# Patient Record
Sex: Male | Born: 1990 | Race: White | Hispanic: No | Marital: Single | Smoking: Former smoker
Health system: Southern US, Community
[De-identification: ages and names within clinical notes are randomized; demographics above are authoritative.]

## PROBLEM LIST (undated history)

## (undated) DIAGNOSIS — F329 Major depressive disorder, single episode, unspecified: Secondary | ICD-10-CM

## (undated) DIAGNOSIS — F32A Depression, unspecified: Secondary | ICD-10-CM

## (undated) HISTORY — DX: Depression, unspecified: F32.A

## (undated) HISTORY — DX: Major depressive disorder, single episode, unspecified: F32.9

---

## 2011-01-10 ENCOUNTER — Emergency Department (HOSPITAL_COMMUNITY)
Admission: EM | Admit: 2011-01-10 | Discharge: 2011-01-10 | Disposition: A | Discharge status: Home / Self Care | Payer: BC Managed Care – PPO | Attending: Emergency Medicine | Admitting: Emergency Medicine

## 2011-01-10 DIAGNOSIS — F3289 Other specified depressive episodes: Secondary | ICD-10-CM | POA: Insufficient documentation

## 2011-01-10 DIAGNOSIS — R110 Nausea: Secondary | ICD-10-CM | POA: Insufficient documentation

## 2011-01-10 DIAGNOSIS — F329 Major depressive disorder, single episode, unspecified: Secondary | ICD-10-CM | POA: Insufficient documentation

## 2011-01-10 DIAGNOSIS — R42 Dizziness and giddiness: Secondary | ICD-10-CM | POA: Insufficient documentation

## 2011-01-10 DIAGNOSIS — F1910 Other psychoactive substance abuse, uncomplicated: Secondary | ICD-10-CM | POA: Insufficient documentation

## 2013-01-06 ENCOUNTER — Telehealth: Payer: Self-pay | Admitting: Nurse Practitioner

## 2013-01-06 NOTE — Telephone Encounter (Signed)
Spoke with dad and he is aware

## 2013-01-06 NOTE — Telephone Encounter (Signed)
Will have to see dentist- cannot do pain meds without being seen

## 2013-01-06 NOTE — Telephone Encounter (Signed)
Please advise 

## 2013-01-18 ENCOUNTER — Other Ambulatory Visit: Payer: Self-pay | Admitting: Nurse Practitioner

## 2013-04-09 ENCOUNTER — Other Ambulatory Visit: Payer: Self-pay | Admitting: Nurse Practitioner

## 2013-04-10 ENCOUNTER — Other Ambulatory Visit: Payer: Self-pay | Admitting: Nurse Practitioner

## 2013-04-10 MED ORDER — FLUOXETINE HCL 20 MG PO CAPS: 20.0000 mg | ORAL_CAPSULE | Freq: Every day | ORAL | Status: DC

## 2013-04-10 NOTE — Progress Notes (Signed)
Mom notified that rx sent to pharmacy

## 2013-05-11 ENCOUNTER — Telehealth: Payer: Self-pay | Admitting: Nurse Practitioner

## 2013-05-11 MED ORDER — FLUOXETINE HCL 20 MG PO CAPS: 20.0000 mg | ORAL_CAPSULE | Freq: Every day | ORAL | Status: DC

## 2013-05-11 NOTE — Telephone Encounter (Signed)
Refill sent to pharmacy.   

## 2013-05-11 NOTE — Telephone Encounter (Signed)
Mom states that he needs refill on prozac. Will make an appt but she will be out of town before they can come in . Can you refill one time and they will make appt. Please advise

## 2013-05-29 ENCOUNTER — Telehealth: Payer: Self-pay | Admitting: Nurse Practitioner

## 2013-05-29 NOTE — Telephone Encounter (Signed)
Pt very congested with cough appt offered Wanted to try something OTC first Recommended Mucinex

## 2013-05-30 ENCOUNTER — Telehealth: Payer: Self-pay | Admitting: Nurse Practitioner

## 2013-05-30 ENCOUNTER — Ambulatory Visit (INDEPENDENT_AMBULATORY_CARE_PROVIDER_SITE_OTHER): Payer: BC Managed Care – PPO | Admitting: General Practice

## 2013-05-30 ENCOUNTER — Encounter: Payer: Self-pay | Admitting: General Practice

## 2013-05-30 VITALS — BP 135/84 | HR 66 | Temp 98.5°F | Ht 77.0 in | Wt 178.5 lb

## 2013-05-30 DIAGNOSIS — J329 Chronic sinusitis, unspecified: Secondary | ICD-10-CM

## 2013-05-30 MED ORDER — AZITHROMYCIN 250 MG PO TABS: ORAL_TABLET | ORAL | Status: DC

## 2013-05-30 NOTE — Patient Instructions (Signed)

## 2013-05-30 NOTE — Telephone Encounter (Signed)
appt given for today 

## 2013-05-30 NOTE — Progress Notes (Signed)
  Subjective:    Patient ID: Gary Campos, male    DOB: January 21, 1991, 22 y.o.   MRN: [id number]  Sinusitis This is a new problem. The current episode started yesterday. The problem has been gradually worsening since onset. Maximum temperature: fever unmeasured. Associated symptoms include congestion, coughing, sinus pressure, sneezing and a sore throat. Pertinent negatives include no chills, headaches or shortness of breath. Past treatments include acetaminophen. The treatment provided mild relief.      Review of Systems  Constitutional: Negative for chills.  HENT: Positive for congestion, sinus pressure, sneezing and sore throat.   Respiratory: Positive for cough. Negative for chest tightness and shortness of breath.   Cardiovascular: Negative for chest pain and palpitations.  Neurological: Negative for dizziness, weakness and headaches.       Objective:   Physical Exam  Constitutional: He is oriented to person, place, and time. He appears well-developed and well-nourished.  HENT:  Head: Normocephalic and atraumatic.  Right Ear: External ear normal.  Left Ear: External ear normal.  Nose: Right sinus exhibits maxillary sinus tenderness and frontal sinus tenderness. Left sinus exhibits maxillary sinus tenderness and frontal sinus tenderness.  Mouth/Throat: Posterior oropharyngeal erythema present.  Cardiovascular: Normal rate, regular rhythm and normal heart sounds.   Pulmonary/Chest: Effort normal and breath sounds normal. No respiratory distress. He exhibits no tenderness.  Neurological: He is alert and oriented to person, place, and time.  Skin: Skin is warm and dry.  Psychiatric: He has a normal mood and affect.          Assessment & Plan:  1. Sinusitis - azithromycin (ZITHROMAX) 250 MG tablet; Take as directed  Dispense: 6 tablet; Refill: 0 -increase fluids -RTO if symptoms worsen or unresolved -Patient verbalized understanding -Coralie Keens, FNP-C

## 2013-06-27 ENCOUNTER — Encounter: Payer: Self-pay | Admitting: Nurse Practitioner

## 2013-06-27 ENCOUNTER — Ambulatory Visit (INDEPENDENT_AMBULATORY_CARE_PROVIDER_SITE_OTHER): Payer: BC Managed Care – PPO | Admitting: Nurse Practitioner

## 2013-06-27 VITALS — BP 119/72 | HR 56 | Temp 97.6°F | Ht 77.0 in | Wt 185.0 lb

## 2013-06-27 DIAGNOSIS — F32A Depression, unspecified: Secondary | ICD-10-CM | POA: Insufficient documentation

## 2013-06-27 DIAGNOSIS — F329 Major depressive disorder, single episode, unspecified: Secondary | ICD-10-CM

## 2013-06-27 DIAGNOSIS — Z23 Encounter for immunization: Secondary | ICD-10-CM

## 2013-06-27 MED ORDER — FLUOXETINE HCL 10 MG PO CAPS: 10.0000 mg | ORAL_CAPSULE | Freq: Every day | ORAL | Status: DC

## 2013-06-27 NOTE — Patient Instructions (Signed)
Stress Management Stress is a state of physical or mental tension that often results from changes in your life or normal routine. Some common causes of stress are:  Death of a loved one.  Injuries or severe illnesses.  Getting fired or changing jobs.  Moving into a new home. Other causes may be:  Sexual problems.  Business or financial losses.  Taking on a large debt.  Regular conflict with someone at home or at work.  Constant tiredness from lack of sleep. It is not just bad things that are stressful. It may be stressful to:  Win the lottery.  Get married.  Buy a new car. The amount of stress that can be easily tolerated varies from person to person. Changes generally cause stress, regardless of the types of change. Too much stress can affect your health. It may lead to physical or emotional problems. Too little stress (boredom) may also become stressful. SUGGESTIONS TO REDUCE STRESS:  Talk things over with your family and friends. It often is helpful to share your concerns and worries. If you feel your problem is serious, you may want to get help from a professional counselor.  Consider your problems one at a time instead of lumping them all together. Trying to take care of everything at once may seem impossible. List all the things you need to do and then start with the most important one. Set a goal to accomplish 2 or 3 things each day. If you expect to do too many in a single day you will naturally fail, causing you to feel even more stressed.  Do not use alcohol or drugs to relieve stress. Although you may feel better for a short time, they do not remove the problems that caused the stress. They can also be habit forming.  Exercise regularly - at least 3 times per week. Physical exercise can help to relieve that "uptight" feeling and will relax you.  The shortest distance between despair and hope is often a good night's sleep.  Go to bed and get up on time allowing  yourself time for appointments without being rushed.  Take a short "time-out" period from any stressful situation that occurs during the day. Close your eyes and take some deep breaths. Starting with the muscles in your face, tense them, hold it for a few seconds, then relax. Repeat this with the muscles in your neck, shoulders, hand, stomach, back and legs.  Take good care of yourself. Eat a balanced diet and get plenty of rest.  Schedule time for having fun. Take a break from your daily routine to relax. HOME CARE INSTRUCTIONS   Call if you feel overwhelmed by your problems and feel you can no longer manage them on your own.  Return immediately if you feel like hurting yourself or someone else. Document Released: 01/06/2001 Document Revised: 10/05/2011 Document Reviewed: 03/07/2013 [employer] Patient Information 2014 [employer].  

## 2013-06-27 NOTE — Progress Notes (Signed)
   Subjective:    Patient ID: Gary Campos, male    DOB: March 05, 1991, 22 y.o.   MRN: [id number]  HPI  patient in today to discuss depression- he is currently on prozac 20- patient says that he is doing well and doesn't need it anymore. Wants to wean hisself off of meds.    Review of Systems  All other systems reviewed and are negative.       Objective:   Physical Exam  Constitutional: He is oriented to person, place, and time. He appears well-developed and well-nourished.  Cardiovascular: Normal rate, regular rhythm and normal heart sounds.   Pulmonary/Chest: Effort normal and breath sounds normal.  Neurological: He is alert and oriented to person, place, and time.  Psychiatric: He has a normal mood and affect. His behavior is normal. Judgment and thought content normal.    BP 119/72  Pulse 56  Temp(Src) 97.6 F (36.4 C) (Oral)  Ht 6\' 5"  (1.956 m)  Wt 185 lb (83.915 kg)  BMI 21.93 kg/m2       Assessment & Plan:   1. Depression    Meds ordered this encounter  Medications  . FLUoxetine (PROZAC) 10 MG capsule    Sig: Take 1 capsule (10 mg total) by mouth daily.    Dispense:  30 capsule    Refill:  3    Order Specific Question:  Supervising Provider    Answer:  Deborra Medina   Decrease from 20mg  of prozac to 10mg  daily- patient instructed how to wean off if wants to stop Stress management Follow up prn  Mary-Margaret Daphine Deutscher, FNP

## 2013-08-28 ENCOUNTER — Telehealth: Payer: Self-pay | Admitting: Nurse Practitioner

## 2013-08-28 NOTE — Telephone Encounter (Signed)
Patient is using an OTC cream. Appt scheduled for tomorrow.

## 2013-08-29 ENCOUNTER — Ambulatory Visit (INDEPENDENT_AMBULATORY_CARE_PROVIDER_SITE_OTHER): Payer: BC Managed Care – PPO | Admitting: Family Medicine

## 2013-08-29 ENCOUNTER — Encounter: Payer: Self-pay | Admitting: Family Medicine

## 2013-08-29 VITALS — BP 137/93 | HR 101 | Temp 97.3°F | Ht 77.0 in | Wt 188.0 lb

## 2013-08-29 DIAGNOSIS — L255 Unspecified contact dermatitis due to plants, except food: Secondary | ICD-10-CM

## 2013-08-29 DIAGNOSIS — L237 Allergic contact dermatitis due to plants, except food: Secondary | ICD-10-CM

## 2013-08-29 MED ORDER — METHYLPREDNISOLONE ACETATE 80 MG/ML IJ SUSP: 80.0000 mg | Freq: Once | INTRAMUSCULAR | Status: DC

## 2013-08-29 MED ORDER — HYDROXYZINE HCL 25 MG PO TABS: 25.0000 mg | ORAL_TABLET | Freq: Three times a day (TID) | ORAL | Status: DC | PRN

## 2013-08-29 MED ORDER — METHYLPREDNISOLONE (PAK) 4 MG PO TABS: ORAL_TABLET | ORAL | Status: DC

## 2013-08-29 NOTE — Patient Instructions (Signed)
Poison Ivy Poison ivy is a inflammation of the skin (contact dermatitis) caused by touching the allergens on the leaves of the ivy plant following previous exposure to the plant. The rash usually appears 48 hours after exposure. The rash is usually bumps (papules) or blisters (vesicles) in a linear pattern. Depending on your own sensitivity, the rash may simply cause redness and itching, or it may also progress to blisters which may break open. These must be well cared for to prevent secondary bacterial (germ) infection, followed by scarring. Keep any open areas dry, clean, dressed, and covered with an antibacterial ointment if needed. The eyes may also get puffy. The puffiness is worst in the morning and gets better as the day progresses. This dermatitis usually heals without scarring, within 2 to 3 weeks without treatment. HOME CARE INSTRUCTIONS  Thoroughly wash with soap and water as soon as you have been exposed to poison ivy. You have about one half hour to remove the plant resin before it will cause the rash. This washing will destroy the oil or antigen on the skin that is causing, or will cause, the rash. Be sure to wash under your fingernails as any plant resin there will continue to spread the rash. Do not rub skin vigorously when washing affected area. Poison ivy cannot spread if no oil from the plant remains on your body. A rash that has progressed to weeping sores will not spread the rash unless you have not washed thoroughly. It is also important to wash any clothes you have been wearing as these may carry active allergens. The rash will return if you wear the unwashed clothing, even several days later. Avoidance of the plant in the future is the best measure. Poison ivy plant can be recognized by the number of leaves. Generally, poison ivy has three leaves with flowering branches on a single stem. Diphenhydramine may be purchased over the counter and used as needed for itching. Do not drive with  this medication if it makes you drowsy.Ask your caregiver about medication for children. SEEK MEDICAL CARE IF:  Open sores develop.  Redness spreads beyond area of rash.  You notice purulent (pus-like) discharge.  You have increased pain.  Other signs of infection develop (such as fever). Document Released: 07/10/2000 Document Revised: 10/05/2011 Document Reviewed: 05/29/2009 Exit[employer] Patient Information 2014 [employer].  

## 2013-08-29 NOTE — Progress Notes (Signed)
   Subjective:    Patient ID: Gary Campos, male    DOB: [birth year], 23 y.o.   MRN: [id number]  HPI This 23 y.o. male presents for evaluation of dermatitis from poison ivy exposure.   Review of Systems No chest pain, SOB, HA, dizziness, vision change, N/V, diarrhea, constipation, dysuria, urinary urgency or frequency, myalgias, arthralgias or rash.     Objective:   Physical Exam Vital signs noted  Well developed well nourished male.  HEENT - Head atraumatic Normocephalic                Eyes - PERRLA, Conjuctiva - clear Sclera- Clear EOMI                Ears - EAC's Wnl TM's Wnl Gross Hearing WNL Respiratory - Lungs CTA bilateral Cardiac - RRR S1 and S2 without murmur Skin - erythematous raised rash on right abdomen, bilateral arms and legs       Assessment & Plan:   Poison ivy dermatitis - Plan: methylPREDNIsolone (MEDROL DOSPACK) 4 MG tablet, hydrOXYzine (ATARAX/VISTARIL) 25 MG tablet, methylPREDNISolone acetate (DEPO-MEDROL) injection 80 mg  Aveeno baths prn and follow up if not better.  Lysbeth Penner FNP

## 2013-10-10 ENCOUNTER — Other Ambulatory Visit: Payer: Self-pay

## 2013-10-10 MED ORDER — FLUOXETINE HCL 10 MG PO CAPS: 10.0000 mg | ORAL_CAPSULE | Freq: Every day | ORAL | Status: DC

## 2013-12-13 ENCOUNTER — Ambulatory Visit (INDEPENDENT_AMBULATORY_CARE_PROVIDER_SITE_OTHER): Payer: BC Managed Care – PPO | Admitting: Physician Assistant

## 2013-12-13 ENCOUNTER — Ambulatory Visit: Payer: BC Managed Care – PPO | Admitting: Family Medicine

## 2013-12-13 ENCOUNTER — Encounter: Payer: Self-pay | Admitting: Physician Assistant

## 2013-12-13 VITALS — BP 151/88 | HR 64 | Temp 98.1°F | Ht 77.0 in | Wt 198.0 lb

## 2013-12-13 DIAGNOSIS — R0789 Other chest pain: Secondary | ICD-10-CM

## 2013-12-13 DIAGNOSIS — R071 Chest pain on breathing: Secondary | ICD-10-CM

## 2013-12-13 MED ORDER — MELOXICAM 15 MG PO TABS: 15.0000 mg | ORAL_TABLET | Freq: Every day | ORAL | Status: DC

## 2013-12-13 NOTE — Patient Instructions (Signed)

## 2013-12-13 NOTE — Progress Notes (Signed)
Subjective:     Patient ID: Gary Campos, male   DOB: Dec 13, 1990, 23 y.o.   MRN: [id number]  HPI Pt with R ant chest wall pain since this am States he was stretching and felt a pop He has a hx of same but not the pain  Review of Systems + pain with cough/deep breath No SOB No prod cough Denies any recent URI sx No OTC meds for sx    Objective:   Physical Exam NAD Heart-RRR w/o M Lungs- CTA bilat + TTP along the R side of sternum only     Assessment:     Chest wall pain    Plan:     Heat/Ice Mobic rx 15mg  qd #10 Work note  F/U prn

## 2013-12-24 ENCOUNTER — Other Ambulatory Visit: Payer: Self-pay | Admitting: Family Medicine

## 2014-04-14 ENCOUNTER — Other Ambulatory Visit: Payer: Self-pay | Admitting: Physician Assistant

## 2014-04-16 NOTE — Telephone Encounter (Signed)
no more refills without being seen  

## 2014-04-16 NOTE — Telephone Encounter (Signed)
Last ov 5/15 for chest pain. Last ov for depression for 12/14. ntbs.

## 2014-05-12 ENCOUNTER — Telehealth: Payer: Self-pay | Admitting: Nurse Practitioner

## 2014-05-12 ENCOUNTER — Ambulatory Visit (INDEPENDENT_AMBULATORY_CARE_PROVIDER_SITE_OTHER): Payer: BC Managed Care – PPO

## 2014-05-12 DIAGNOSIS — Z23 Encounter for immunization: Secondary | ICD-10-CM

## 2014-05-14 NOTE — Telephone Encounter (Signed)
appt made

## 2014-05-18 ENCOUNTER — Ambulatory Visit (INDEPENDENT_AMBULATORY_CARE_PROVIDER_SITE_OTHER): Payer: BC Managed Care – PPO | Admitting: Nurse Practitioner

## 2014-05-18 ENCOUNTER — Encounter: Payer: Self-pay | Admitting: Nurse Practitioner

## 2014-05-18 VITALS — BP 158/94 | HR 62 | Temp 98.6°F | Ht 77.0 in | Wt 200.4 lb

## 2014-05-18 DIAGNOSIS — F329 Major depressive disorder, single episode, unspecified: Secondary | ICD-10-CM

## 2014-05-18 DIAGNOSIS — F32A Depression, unspecified: Secondary | ICD-10-CM

## 2014-05-18 MED ORDER — FLUOXETINE HCL 10 MG PO CAPS: ORAL_CAPSULE | ORAL | Status: DC

## 2014-05-18 NOTE — Progress Notes (Signed)
   Subjective:    Patient ID: Gary Campos, male    DOB: 03-13-1991, 23 y.o.   MRN: [id number]  HPI Patient in today for follow up of depression and anxiety- He is currently on prozac- works well and keeps him calm- No c/o side effects.    Review of Systems  Constitutional: Negative.   HENT: Negative.   Respiratory: Negative.   Cardiovascular: Negative.   Genitourinary: Negative.   Neurological: Negative.   Psychiatric/Behavioral: Negative.   All other systems reviewed and are negative.      Objective:   Physical Exam  Constitutional: He is oriented to person, place, and time. He appears well-developed and well-nourished.  Cardiovascular: Normal rate, regular rhythm and normal heart sounds.   Pulmonary/Chest: Effort normal and breath sounds normal.  Neurological: He is alert and oriented to person, place, and time.  Skin: Skin is warm and dry.  Psychiatric: He has a normal mood and affect. His behavior is normal. Judgment and thought content normal.   BP 158/94  Pulse 62  Temp(Src) 98.6 F (37 C) (Oral)  Ht 6\' 5"  (1.956 m)  Wt 200 lb 6.4 oz (90.901 kg)  BMI 23.76 kg/m2        Assessment & Plan:  1. Depression Stress management - FLUoxetine (PROZAC) 10 MG capsule; TAKE 1 CAPSULE (10 MG TOTAL) BY MOUTH DAILY.  Dispense: 30 capsule; Refill: [place], FNP

## 2014-05-18 NOTE — Patient Instructions (Signed)
Stress and Stress Management Stress is a normal reaction to life events. It is what you feel when life demands more than you are used to or more than you can handle. Some stress can be useful. For example, the stress reaction can help you catch the last bus of the day, study for a test, or meet a deadline at work. But stress that occurs too often or for too long can cause problems. It can affect your emotional health and interfere with relationships and normal daily activities. Too much stress can weaken your immune system and increase your risk for physical illness. If you already have a medical problem, stress can make it worse. CAUSES  All sorts of life events may cause stress. An event that causes stress for one person may not be stressful for another person. Major life events commonly cause stress. These may be positive or negative. Examples include losing your job, moving into a new home, getting married, having a baby, or losing a loved one. Less obvious life events may also cause stress, especially if they occur day after day or in combination. Examples include working long hours, driving in traffic, caring for children, being in debt, or being in a difficult relationship. SIGNS AND SYMPTOMS Stress may cause emotional symptoms including, the following:  Anxiety. This is feeling worried, afraid, on edge, overwhelmed, or out of control.  Anger. This is feeling irritated or impatient.  Depression. This is feeling sad, down, helpless, or guilty.  Difficulty focusing, remembering, or making decisions. Stress may cause physical symptoms, including the following:   Aches and pains. These may affect your head, neck, back, stomach, or other areas of your body.  Tight muscles or clenched jaw.  Low energy or trouble sleeping. Stress may cause unhealthy behaviors, including the following:   Eating to feel better (overeating) or skipping meals.  Sleeping too little, too much, or both.  Working  too much or putting off tasks (procrastination).  Smoking, drinking alcohol, or using drugs to feel better. DIAGNOSIS  Stress is diagnosed through an assessment by your health care provider. Your health care provider will ask questions about your symptoms and any stressful life events.Your health care provider will also ask about your medical history and may order blood tests or other tests. Certain medical conditions and medicine can cause physical symptoms similar to stress. Mental illness can cause emotional symptoms and unhealthy behaviors similar to stress. Your health care provider may refer you to a mental health professional for further evaluation.  TREATMENT  Stress management is the recommended treatment for stress.The goals of stress management are reducing stressful life events and coping with stress in healthy ways.  Techniques for reducing stressful life events include the following:  Stress identification. Self-monitor for stress and identify what causes stress for you. These skills may help you to avoid some stressful events.  Time management. Set your priorities, keep a calendar of events, and learn to say "no." These tools can help you avoid making too many commitments. Techniques for coping with stress include the following:  Rethinking the problem. Try to think realistically about stressful events rather than ignoring them or overreacting. Try to find the positives in a stressful situation rather than focusing on the negatives.  Exercise. Physical exercise can release both physical and emotional tension. The key is to find a form of exercise you enjoy and do it regularly.  Relaxation techniques. These relax the body and mind. Examples include yoga, meditation, tai chi, biofeedback, deep  breathing, progressive muscle relaxation, listening to music, being out in nature, journaling, and other hobbies. Again, the key is to find one or more that you enjoy and can do  regularly.  Healthy lifestyle. Eat a balanced diet, get plenty of sleep, and do not smoke. Avoid using alcohol or drugs to relax.  Strong support network. Spend time with family, friends, or other people you enjoy being around.Express your feelings and talk things over with someone you trust. Counseling or talktherapy with a mental health professional may be helpful if you are having difficulty managing stress on your own. Medicine is typically not recommended for the treatment of stress.Talk to your health care provider if you think you need medicine for symptoms of stress. HOME CARE INSTRUCTIONS  Keep all follow-up visits as directed by your health care provider.  Take all medicines as directed by your health care provider. SEEK MEDICAL CARE IF:  Your symptoms get worse or you start having new symptoms.  You feel overwhelmed by your problems and can no longer manage them on your own. SEEK IMMEDIATE MEDICAL CARE IF:  You feel like hurting yourself or someone else. Document Released: 01/06/2001 Document Revised: 11/27/2013 Document Reviewed: 03/07/2013 Exit[employer] Patient Information 2015 [employer]. This information is not intended to replace advice given to you by your health care provider. Make sure you discuss any questions you have with your health care provider.  

## 2014-10-17 ENCOUNTER — Encounter: Payer: Self-pay | Admitting: Family

## 2014-10-17 ENCOUNTER — Ambulatory Visit (INDEPENDENT_AMBULATORY_CARE_PROVIDER_SITE_OTHER): Payer: BC Managed Care – PPO | Admitting: Family

## 2014-10-17 VITALS — BP 160/102 | HR 65 | Temp 97.8°F | Ht 77.0 in | Wt 201.4 lb

## 2014-10-17 DIAGNOSIS — Z202 Contact with and (suspected) exposure to infections with a predominantly sexual mode of transmission: Secondary | ICD-10-CM | POA: Diagnosis not present

## 2014-10-17 MED ORDER — AZITHROMYCIN 500 MG PO TABS: 1000.0000 mg | ORAL_TABLET | Freq: Every day | ORAL | Status: DC

## 2014-10-17 NOTE — Progress Notes (Addendum)
   Subjective:    Patient ID:  Shellhammer, male    DOB: 1990/11/10, 24 y.o.   MRN: [id number]  Exposure to STD The patient's pertinent negatives include no genital itching, genital lesions, penile discharge, penile pain, scrotal swelling or testicular pain. This is a new problem. The problem occurs constantly. The problem has been waxing and waning. The patient is experiencing no pain. Pertinent negatives include no chest pain, chills, coughing, diarrhea, frequency, hesitancy, joint pain, joint swelling, painful intercourse, sore throat or urinary retention. Yes, his partner has an STD.   Pt states his girlfriend was tested positive for chlamydia yesterday. Pt states his girlfriend has only ever been with him and states he must have it. PT states his girlfriend was tested negative for STD's in November and he has been with someone else in January and believes he contracted chlamydia then. States he feels like his butt cheeks are "raw" and states that started about a month ago. Pt states he thought it was related to hemorrhoids.    Review of Systems  Constitutional: Negative.  Negative for chills.  HENT: Negative.  Negative for sore throat.   Respiratory: Negative.  Negative for cough.   Cardiovascular: Negative.  Negative for chest pain.  Gastrointestinal: Negative.  Negative for diarrhea.  Endocrine: Negative.   Genitourinary: Negative.  Negative for hesitancy, frequency, discharge, scrotal swelling, penile pain and testicular pain.  Musculoskeletal: Negative.  Negative for joint pain.  Neurological: Negative.   Hematological: Negative.   Psychiatric/Behavioral: Negative.   All other systems reviewed and are negative.      Objective:   Physical Exam  Constitutional: He is oriented to person, place, and time. He appears well-developed and well-nourished. No distress.  Eyes: Pupils are equal, round, and reactive to light. Right eye exhibits no discharge. Left eye exhibits no discharge.    Neck: Normal range of motion. Neck supple. No thyromegaly present.  Cardiovascular: Normal rate, regular rhythm, normal heart sounds and intact distal pulses.   No murmur heard. Pulmonary/Chest: Effort normal and breath sounds normal. No respiratory distress. He has no wheezes.  Abdominal: Soft. Bowel sounds are normal. He exhibits no distension. There is no tenderness.  Genitourinary: Rectum normal and penis normal.  Inner gluteal folds near rectum erythemas- No lesions  Musculoskeletal: Normal range of motion. He exhibits no edema or tenderness.  Neurological: He is alert and oriented to person, place, and time. He has normal reflexes. No cranial nerve deficit.  Skin: Skin is warm and dry. No rash noted. No erythema.  Psychiatric: He has a normal mood and affect. His behavior is normal. Judgment and thought content normal.  Vitals reviewed.     BP 160/102 mmHg  Pulse 65  Temp(Src) 97.8 F (36.6 C) (Oral)  Ht 6\' 5"  (1.956 m)  Wt 201 lb 6.4 oz (91.354 kg)  BMI 23.88 kg/m2     Assessment & Plan:  1. Exposure to STD - GC/Chlamydia Probe Amp  2. Exposure to chlamydia -Safe sex -Do not have sex until pt completes antibiotic and his partner   - azithromycin (ZITHROMAX) 500 MG tablet; Take 2 tablets (1,000 mg total) by mouth daily.  Dispense: 2 tablet; Refill: 0  Evelina Dun, FNP

## 2014-10-17 NOTE — Patient Instructions (Signed)
Chlamydia Chlamydia is an infection. It is spread through sexual contact. Chlamydia can be in different areas of the body. These areas include the urethra, throat, or rectum. It is important to treat chlamydia as soon as possible. It can damage other organs.  CAUSES  Chlamydia is caused by bacteria. It is a sexually transmitted disease. This means that it is passed from an infected partner during intimate contact. This contact could be with the genitals, mouth, or rectal area.  SIGNS AND SYMPTOMS  There may not be any symptoms. This is often the case early in the infection. If there are symptoms, they are usually mild and may only be noticeable in the morning. Symptoms you may notice include:   Burning with urination.  Pain or swelling in the testicles.  Watery mucus-like discharge from the penis.  Long-standing (chronic) pelvic pain after frequent infections.  Pain, swelling, or itching around the anus.  A sore throat.  Itching, burning, or redness in the eyes, or discharge from the eyes. DIAGNOSIS  To diagnose this infection, your health care provider will do a pelvic exam. A sample of urine or a swab from the rectum may be taken for testing.  TREATMENT  Chlamydia is treated with antibiotic medicines.  HOME CARE INSTRUCTIONS  Take your antibiotic medicine as directed by your health care provider. Finish the antibiotic even if you start to feel better. Incomplete treatment will put you at risk for not being able to have children (sterility).   Take medicines only as directed by your health care provider.   Rest.   Inform any sexual partners about your infection. Even if they are symptom free or have a negative culture or evaluation, they should be treated for the condition.   Do not have sex (intercourse) until treatment is completed and your health care provider says it is okay.   Keep all follow-up visits as directed by your health care provider.   Not all test results  are available during your visit. If your test results are not back during the visit, make an appointment with your health care provider to find out the results. Do not assume everything is normal if you have not heard from your health care provider or the medical facility. It is your responsibility to get your test results. SEEK MEDICAL CARE IF:  You develop new joint pain.  You have a fever. SEEK IMMEDIATE MEDICAL CARE IF:   Your pain increases.   You have abnormal discharge.   You have pain during intercourse. MAKE SURE YOU:   Understand these instructions.  Will watch your condition.  Will get help right away if you are not doing well or get worse. Document Released: 07/13/2005 Document Revised: 11/27/2013 Document Reviewed: 01/19/2013 [facility] Patient Information 2015 [place], [place]. This information is not intended to replace advice given to you by your health care provider. Make sure you discuss any questions you have with your health care provider.

## 2014-10-22 LAB — GC/CHLAMYDIA PROBE AMP
CHLAMYDIA, DNA PROBE: POSITIVE — AB
NEISSERIA GONORRHOEAE BY PCR: NEGATIVE

## 2014-10-30 ENCOUNTER — Telehealth: Payer: Self-pay | Admitting: Nurse Practitioner

## 2014-10-30 NOTE — Telephone Encounter (Signed)
Patient states that he was told he needed a follow up on chlamydia does he need to follow up with you or just recheck his urine? Please advise

## 2014-10-31 ENCOUNTER — Ambulatory Visit (INDEPENDENT_AMBULATORY_CARE_PROVIDER_SITE_OTHER): Payer: BC Managed Care – PPO | Admitting: Family

## 2014-10-31 ENCOUNTER — Encounter: Payer: Self-pay | Admitting: Family

## 2014-10-31 VITALS — BP 142/87 | HR 64 | Temp 97.8°F | Ht 77.0 in | Wt 201.2 lb

## 2014-10-31 DIAGNOSIS — A749 Chlamydial infection, unspecified: Secondary | ICD-10-CM

## 2014-10-31 NOTE — Patient Instructions (Signed)
Chlamydia Chlamydia is an infection. It is spread through sexual contact. Chlamydia can be in different areas of the body. These areas include the urethra, throat, or rectum. It is important to treat chlamydia as soon as possible. It can damage other organs.  CAUSES  Chlamydia is caused by bacteria. It is a sexually transmitted disease. This means that it is passed from an infected partner during intimate contact. This contact could be with the genitals, mouth, or rectal area.  SIGNS AND SYMPTOMS  There may not be any symptoms. This is often the case early in the infection. If there are symptoms, they are usually mild and may only be noticeable in the morning. Symptoms you may notice include:   Burning with urination.  Pain or swelling in the testicles.  Watery mucus-like discharge from the penis.  Long-standing (chronic) pelvic pain after frequent infections.  Pain, swelling, or itching around the anus.  A sore throat.  Itching, burning, or redness in the eyes, or discharge from the eyes. DIAGNOSIS  To diagnose this infection, your health care provider will do a pelvic exam. A sample of urine or a swab from the rectum may be taken for testing.  TREATMENT  Chlamydia is treated with antibiotic medicines.  HOME CARE INSTRUCTIONS  Take your antibiotic medicine as directed by your health care provider. Finish the antibiotic even if you start to feel better. Incomplete treatment will put you at risk for not being able to have children (sterility).   Take medicines only as directed by your health care provider.   Rest.   Inform any sexual partners about your infection. Even if they are symptom free or have a negative culture or evaluation, they should be treated for the condition.   Do not have sex (intercourse) until treatment is completed and your health care provider says it is okay.   Keep all follow-up visits as directed by your health care provider.   Not all test results  are available during your visit. If your test results are not back during the visit, make an appointment with your health care provider to find out the results. Do not assume everything is normal if you have not heard from your health care provider or the medical facility. It is your responsibility to get your test results. SEEK MEDICAL CARE IF:  You develop new joint pain.  You have a fever. SEEK IMMEDIATE MEDICAL CARE IF:   Your pain increases.   You have abnormal discharge.   You have pain during intercourse. MAKE SURE YOU:   Understand these instructions.  Will watch your condition.  Will get help right away if you are not doing well or get worse. Document Released: 07/13/2005 Document Revised: 11/27/2013 Document Reviewed: 01/19/2013 [facility] Patient Information 2015 [place], [place]. This information is not intended to replace advice given to you by your health care provider. Make sure you discuss any questions you have with your health care provider.

## 2014-10-31 NOTE — Telephone Encounter (Signed)
Pt NTBS

## 2014-10-31 NOTE — Progress Notes (Signed)
   Subjective:    Patient ID: Gary Campos, male    DOB: 03/09/1991, 24 y.o.   MRN: [id number]  HPI Pt presents to the office today to be recheck for Chlamydia. Pt was treated for chlamydia on 10/17/14. Pt states he finished his azithromycin. PT states he is no longer having any irration around his rectum. Pt states he is having a rash below his belly button in his hair line. Pt states he has some "itching" that is more noticeable when he sweats. Pt's girlfriend ( who is pregnant) tested positive also and was treated. She was retested yesterday and is waiting the test results.    Review of Systems  Constitutional: Negative.   HENT: Negative.   Respiratory: Negative.   Cardiovascular: Negative.   Gastrointestinal: Negative.   Endocrine: Negative.   Genitourinary: Negative.   Musculoskeletal: Negative.   Neurological: Negative.   Hematological: Negative.   Psychiatric/Behavioral: Negative.   All other systems reviewed and are negative.      Objective:   Physical Exam  Constitutional: He is oriented to person, place, and time. He appears well-developed and well-nourished. No distress.  HENT:  Head: Normocephalic.  Eyes: Pupils are equal, round, and reactive to light. Right eye exhibits no discharge. Left eye exhibits no discharge.  Neck: Normal range of motion. Neck supple. No thyromegaly present.  Cardiovascular: Normal rate, regular rhythm, normal heart sounds and intact distal pulses.   No murmur heard. Pulmonary/Chest: Effort normal and breath sounds normal. No respiratory distress. He has no wheezes.  Abdominal: Soft. Bowel sounds are normal. He exhibits no distension. There is no tenderness.  Musculoskeletal: Normal range of motion. He exhibits no edema or tenderness.  Neurological: He is alert and oriented to person, place, and time. He has normal reflexes. No cranial nerve deficit.  Skin: Skin is warm and dry. No rash noted. No erythema.  Psychiatric: He has a normal mood and  affect. His behavior is normal. Judgment and thought content normal.  Vitals reviewed.   BP 142/87 mmHg  Pulse 64  Temp(Src) 97.8 F (36.6 C) (Oral)  Ht 6\' 5"  (1.956 m)  Wt 201 lb 3.2 oz (91.264 kg)  BMI 23.85 kg/m2       Assessment & Plan:  1. Chlamydia infection Safe sex discussed -Labs pending -RTO prn- If positive will retreat and will need to be tested again - GC/Chlamydia Probe Amp  Evelina Dun, FNP

## 2014-11-02 ENCOUNTER — Telehealth: Payer: Self-pay | Admitting: Nurse Practitioner

## 2014-11-03 LAB — GC/CHLAMYDIA PROBE AMP
Chlamydia trachomatis, NAA: NEGATIVE
Neisseria gonorrhoeae by PCR: NEGATIVE

## 2014-11-09 NOTE — Telephone Encounter (Signed)
Patient aware of normal lab results

## 2015-05-03 ENCOUNTER — Ambulatory Visit (INDEPENDENT_AMBULATORY_CARE_PROVIDER_SITE_OTHER): Payer: BC Managed Care – PPO | Admitting: *Deleted

## 2015-05-03 DIAGNOSIS — Z23 Encounter for immunization: Secondary | ICD-10-CM

## 2015-05-03 NOTE — Progress Notes (Signed)
Pt given Boostrix and flu vaccine today and tolerated well.

## 2016-01-24 ENCOUNTER — Encounter: Payer: Self-pay | Admitting: Family

## 2016-01-24 ENCOUNTER — Ambulatory Visit (INDEPENDENT_AMBULATORY_CARE_PROVIDER_SITE_OTHER): Payer: BC Managed Care – PPO | Admitting: Family

## 2016-01-24 VITALS — BP 136/84 | HR 73 | Temp 97.6°F | Ht 77.0 in | Wt 191.0 lb

## 2016-01-24 DIAGNOSIS — L700 Acne vulgaris: Secondary | ICD-10-CM | POA: Diagnosis not present

## 2016-01-24 NOTE — Patient Instructions (Signed)

## 2016-01-24 NOTE — Progress Notes (Signed)
   Subjective:    Patient ID: Gary Campos, male    DOB: Nov 25, 1990, 25 y.o.   MRN: [id number]  HPI PT presents to the office today with a "knot" on forehead that he noticed this AM. PT denies any trauma to the area. Pt states he has aching constant pain of 5 of 10. PT denies any treatment for it. PT denies any headache or visual changes.   Review of Systems  HENT: Negative for congestion, dental problem, drooling, ear discharge, ear pain, hearing loss, nosebleeds, postnasal drip, sore throat, tinnitus and trouble swallowing.   Eyes: Negative for photophobia, pain, discharge and visual disturbance.  All other systems reviewed and are negative.      Objective:   Physical Exam  Constitutional: He is oriented to person, place, and time. He appears well-developed and well-nourished. No distress.  HENT:  Head: Normocephalic.  Cardiovascular: Normal rate, regular rhythm, normal heart sounds and intact distal pulses.   No murmur heard. Pulmonary/Chest: Effort normal and breath sounds normal. No respiratory distress. He has no wheezes.  Abdominal: Soft. Bowel sounds are normal. He exhibits no distension. There is no tenderness.  Musculoskeletal: Normal range of motion. He exhibits no edema or tenderness.  Neurological: He is alert and oriented to person, place, and time.  Skin: Skin is warm and dry. No rash noted. No erythema.  Small nodule on forehead, no warmth, erythemas, or discharge  Psychiatric: He has a normal mood and affect. His behavior is normal. Judgment and thought content normal.  Vitals reviewed.   BP 136/84 mmHg  Pulse 73  Temp(Src) 97.6 F (36.4 C) (Oral)  Ht 6\' 5"  (1.956 m)  Wt 191 lb (86.637 kg)  BMI 22.64 kg/m2       Assessment & Plan:  1. Acne vulgaris -Keep face clean and dry -Do not pick at area -Warm compresses RTO prn  Evelina Dun, FNP

## 2016-08-19 ENCOUNTER — Ambulatory Visit (INDEPENDENT_AMBULATORY_CARE_PROVIDER_SITE_OTHER): Payer: BC Managed Care – PPO | Admitting: Family Medicine

## 2016-08-19 ENCOUNTER — Encounter: Payer: Self-pay | Admitting: Family Medicine

## 2016-08-19 VITALS — BP 131/77 | HR 52 | Temp 98.3°F | Ht 77.0 in | Wt 194.0 lb

## 2016-08-19 DIAGNOSIS — D170 Benign lipomatous neoplasm of skin and subcutaneous tissue of head, face and neck: Secondary | ICD-10-CM | POA: Diagnosis not present

## 2016-08-19 DIAGNOSIS — H60331 Swimmer's ear, right ear: Secondary | ICD-10-CM | POA: Diagnosis not present

## 2016-08-19 MED ORDER — CIPROFLOXACIN-HYDROCORTISONE 0.2-1 % OT SUSP: 3.0000 [drp] | Freq: Two times a day (BID) | OTIC | 0 refills | Status: DC

## 2016-08-19 NOTE — Progress Notes (Signed)
BP 131/77   Pulse (!) 52   Temp 98.3 F (36.8 C) (Oral)   Ht 6\' 5"  (1.956 m)   Wt 194 lb (88 kg)   BMI 23.01 kg/m    Subjective:    Patient ID: Gary Campos, male    DOB: 10-10-1990, 26 y.o.   MRN: [id number]  HPI: Gary Campos is a 26 y.o. male presenting on 08/19/2016 for Ear Pain (right)   HPI Right ear pain Patient has been having right ear pain that has been intense and sharp this been going on for the past few days. He has used some ibuprofen and Tylenol which has helped reduce at some today but he still feels like it hurts and is irritated. It is not as sharp as it was previously today. He says that it did keep him up some at night that first night. He denies any drainage or fevers or chills or congestion or cough or runny nose. He denies any sick contacts that he knows of. He has been using Q-tips and rags in his ears to help clean them out.  Lump on neck Patient has a small lump on the back of his neck that's been there for 6 or 7 years but has recently increased in size and become more tender over the past few months. It is located near the base of his neck on the right posterior side. He denies any drainage or overlying skin changes or redness or warmth.  Relevant past medical, surgical, family and social history reviewed and updated as indicated. Interim medical history since our last visit reviewed. Allergies and medications reviewed and updated.  Review of Systems  Constitutional: Negative for chills and fever.  HENT: Positive for ear pain. Negative for ear discharge, facial swelling, sinus pain, sinus pressure, sneezing and sore throat.   Eyes: Negative for discharge.  Respiratory: Negative for shortness of breath and wheezing.   Cardiovascular: Negative for chest pain and leg swelling.  Musculoskeletal: Negative for back pain and gait problem.  Skin: Negative for rash.  All other systems reviewed and are negative.   Per HPI unless specifically indicated above     Objective:    BP 131/77   Pulse (!) 52   Temp 98.3 F (36.8 C) (Oral)   Ht 6\' 5"  (1.956 m)   Wt 194 lb (88 kg)   BMI 23.01 kg/m   Wt Readings from Last 3 Encounters:  08/19/16 194 lb (88 kg)  01/24/16 191 lb (86.6 kg)  10/31/14 201 lb 3.2 oz (91.3 kg)    Physical Exam  Constitutional: He is oriented to person, place, and time. He appears well-developed and well-nourished. No distress.  HENT:  Right Ear: Tympanic membrane normal. There is swelling and tenderness. No drainage. No mastoid tenderness. Tympanic membrane is not injected, not scarred, not perforated, not erythematous and not retracted. No middle ear effusion.  Left Ear: Tympanic membrane, external ear and ear canal normal.  Nose: Nose normal.  Mouth/Throat: Oropharynx is clear and moist. No oropharyngeal exudate.  Eyes: Conjunctivae are normal. Right eye exhibits no discharge. Left eye exhibits no discharge. No scleral icterus.  Cardiovascular: Normal rate, regular rhythm, normal heart sounds and intact distal pulses.   No murmur heard. Pulmonary/Chest: Effort normal and breath sounds normal. No respiratory distress. He has no wheezes.  Musculoskeletal: Normal range of motion. He exhibits no edema.  Neurological: He is alert and oriented to person, place, and time. Coordination normal.  Skin: Skin is warm and  dry. Lesion (Subcutaneous 1 cm posterior cervical nodule that is mobile and slightly tender to palpation with no overlying erythema or warmth.) noted. No rash noted. He is not diaphoretic.  Psychiatric: He has a normal mood and affect. His behavior is normal.  Nursing note and vitals reviewed.     Assessment & Plan:   Problem List Items Addressed This Visit    None    Visit Diagnoses    Acute swimmer's ear of right side    -  Primary   Relevant Medications   ciprofloxacin-hydrocortisone (CIPRO HC) otic suspension   Lipoma of neck       Posterior neck lipoma that has been increasing in size and more painful,  patient will call back when he is ready to have general surgery appointment       Follow up plan: Return if symptoms worsen or fail to improve.  Counseling provided for all of the vaccine components No orders of the defined types were placed in this encounter.   Caryl Pina, MD [facility] 08/19/2016, 5:40 PM

## 2016-08-20 ENCOUNTER — Telehealth: Payer: Self-pay | Admitting: Family

## 2016-08-20 MED ORDER — NEOMYCIN-POLYMYXIN-HC 3.5-10000-1 OT SOLN: 3.0000 [drp] | Freq: Four times a day (QID) | OTIC | 0 refills | Status: DC

## 2016-08-20 NOTE — Telephone Encounter (Signed)
Pt aware.

## 2016-11-17 ENCOUNTER — Ambulatory Visit (INDEPENDENT_AMBULATORY_CARE_PROVIDER_SITE_OTHER): Payer: BC Managed Care – PPO | Admitting: Family

## 2016-11-17 ENCOUNTER — Encounter: Payer: Self-pay | Admitting: Family

## 2016-11-17 VITALS — BP 124/70 | HR 61 | Temp 97.9°F | Ht 77.0 in | Wt 198.8 lb

## 2016-11-17 DIAGNOSIS — S29012A Strain of muscle and tendon of back wall of thorax, initial encounter: Secondary | ICD-10-CM

## 2016-11-17 MED ORDER — NAPROXEN 500 MG PO TABS: 500.0000 mg | ORAL_TABLET | Freq: Two times a day (BID) | ORAL | 1 refills | Status: DC

## 2016-11-17 MED ORDER — BACLOFEN 20 MG PO TABS: 20.0000 mg | ORAL_TABLET | Freq: Three times a day (TID) | ORAL | 0 refills | Status: DC

## 2016-11-17 MED ORDER — PREDNISONE 10 MG (21) PO TBPK: ORAL_TABLET | ORAL | 0 refills | Status: DC

## 2016-11-17 NOTE — Progress Notes (Signed)
   Subjective:    Patient ID: Gary Campos, male    DOB: 1991-02-10, 26 y.o.   MRN: [id number]  Back Pain  This is a new problem. The current episode started 1 to 4 weeks ago. The problem occurs constantly. The problem is unchanged. The pain is present in the thoracic spine. The quality of the pain is described as aching. The pain is at a severity of 7/10. The pain is moderate. The symptoms are aggravated by standing. Pertinent negatives include no bladder incontinence, bowel incontinence or leg pain. Risk factors: goes to gym three times a week and has to lift heavy things at his work. He has tried heat and bed rest for the symptoms. The treatment provided mild relief.      Review of Systems  Gastrointestinal: Negative for bowel incontinence.  Genitourinary: Negative for bladder incontinence.  Musculoskeletal: Positive for back pain.  All other systems reviewed and are negative.      Objective:   Physical Exam  Constitutional: He is oriented to person, place, and time. He appears well-developed and well-nourished. No distress.  HENT:  Head: Normocephalic.  Eyes: Pupils are equal, round, and reactive to light. Right eye exhibits no discharge. Left eye exhibits no discharge.  Neck: Normal range of motion. Neck supple. No thyromegaly present.  Cardiovascular: Normal rate, regular rhythm, normal heart sounds and intact distal pulses.   No murmur heard. Pulmonary/Chest: Effort normal and breath sounds normal. No respiratory distress. He has no wheezes.  Abdominal: Soft. Bowel sounds are normal. He exhibits no distension. There is no tenderness.  Musculoskeletal: Normal range of motion. He exhibits tenderness (right upper thoracic pain with twisting and lifting arms ). He exhibits no edema.  Neurological: He is alert and oriented to person, place, and time.  Skin: Skin is warm and dry. No rash noted. No erythema.  Psychiatric: He has a normal mood and affect. His behavior is normal. Judgment  and thought content normal.  Vitals reviewed.   BP 124/70   Pulse 61   Temp 97.9 F (36.6 C) (Oral)   Ht 6\' 5"  (1.956 m)   Wt 198 lb 12.8 oz (90.2 kg)   BMI 23.57 kg/m        Assessment & Plan:  1. Upper back strain, initial encounter -Rest -Ice and heat -ROM exercises discussed Note given to return to work on 11/19/16 RTO prn  - predniSONE (STERAPRED UNI-PAK 21 TAB) 10 MG (21) TBPK tablet; Use as directed  Dispense: 21 tablet; Refill: 0 - naproxen (NAPROSYN) 500 MG tablet; Take 1 tablet (500 mg total) by mouth 2 (two) times daily with a meal.  Dispense: 60 tablet; Refill: 1 - baclofen (LIORESAL) 20 MG tablet; Take 1 tablet (20 mg total) by mouth 3 (three) times daily.  Dispense: 30 each; Refill: 0   Evelina Dun, FNP

## 2016-11-17 NOTE — Patient Instructions (Signed)
Thoracic Strain A thoracic strain, which is sometimes called a mid-back strain, is an injury to the muscles or tendons that attach to the upper part of your back behind your chest. This type of injury occurs when a muscle is overstretched or overloaded. Thoracic strains can range from mild to severe. Mild strains may involve stretching a muscle or tendon without tearing it. These injuries may heal in 1-2 weeks. More severe strains involve tearing of muscle fibers or tendons. These will cause more pain and may take 6-8 weeks to heal. What are the causes? This condition may be caused by:  An injury in which a sudden force is placed on the muscle.  Exercising without properly warming up.  Overuse of the muscle.  Improper form during certain movements.  Other injuries that surround or cause stress on the mid-back, causing a strain on the muscles. In some cases, the cause may not be known. What increases the risk? This injury is more common in:  Athletes.  People with obesity. What are the signs or symptoms? The main symptom of this condition is pain, especially with movement. Other symptoms include:  Bruising.  Swelling.  Spasm. How is this diagnosed? This condition may be diagnosed with a physical exam. X-rays may be taken to check for a fracture. How is this treated? This condition may be treated with:  Resting and icing the injured area.  Physical therapy. This will involve doing stretching and strengthening exercises.  Medicines for pain and inflammation. Follow these instructions at home:  Rest as needed. Follow instructions from your health care provider about any restrictions on activity.  If directed, apply ice to the injured area:  Put ice in a plastic bag.  Place a towel between your skin and the bag.  Leave the ice on for 20 minutes, 2-3 times per day.  Take over-the-counter and prescription medicines only as told by your health care provider.  Begin doing  exercises as told by your health care provider or physical therapist.  Always warm up properly before physical activity or sports.  Bend your knees before you lift heavy objects.  Keep all follow-up visits as told by your health care provider. This is important. Contact a health care provider if:  Your pain is not helped by medicine.  Your pain, bruising, or swelling is getting worse.  You have a fever. Get help right away if:  You have shortness of breath.  You have chest pain.  You develop numbness or weakness in your legs.  You have involuntary loss of urine (urinary incontinence). This information is not intended to replace advice given to you by your health care provider. Make sure you discuss any questions you have with your health care provider. Document Released: 10/03/2003 Document Revised: 03/14/2016 Document Reviewed: 09/06/2014 Elsevier Interactive Patient Education  2017 [employer].

## 2016-11-18 ENCOUNTER — Telehealth: Payer: Self-pay | Admitting: Family

## 2016-11-18 ENCOUNTER — Other Ambulatory Visit: Payer: Self-pay | Admitting: *Deleted

## 2016-11-18 NOTE — Telephone Encounter (Signed)
Aware. 

## 2016-11-18 NOTE — Telephone Encounter (Signed)
Patient should take medication in am with food.  Six pills day one.  Five pills day two.  Four pills day three. Three pills day four.  Two pills day five and one pill on day six.

## 2016-11-27 ENCOUNTER — Ambulatory Visit: Payer: BC Managed Care – PPO | Admitting: Pediatrics

## 2018-01-07 ENCOUNTER — Encounter: Payer: Self-pay | Admitting: Family Medicine

## 2018-01-07 ENCOUNTER — Ambulatory Visit (INDEPENDENT_AMBULATORY_CARE_PROVIDER_SITE_OTHER): Payer: BC Managed Care – PPO

## 2018-01-07 ENCOUNTER — Ambulatory Visit: Payer: BC Managed Care – PPO | Admitting: Family Medicine

## 2018-01-07 VITALS — BP 130/83 | HR 66 | Temp 97.4°F | Ht 77.0 in | Wt 200.0 lb

## 2018-01-07 DIAGNOSIS — M25572 Pain in left ankle and joints of left foot: Secondary | ICD-10-CM

## 2018-01-07 DIAGNOSIS — S93492A Sprain of other ligament of left ankle, initial encounter: Secondary | ICD-10-CM | POA: Diagnosis not present

## 2018-01-07 NOTE — Progress Notes (Signed)
BP 130/83   Pulse 66   Temp (!) 97.4 F (36.3 C) (Oral)   Ht 6\' 5"  (1.956 m)   Wt 200 lb (90.7 kg)   BMI 23.72 kg/m    Subjective:    Patient ID: Gary Campos, male    DOB: [birth year], 27 y.o.   MRN: [id number]  HPI: Gary Campos is a 27 y.o. male presenting on 01/07/2018 for Left ankle pain (rolled ankle while playing basketball last night)   HPI Sprain ankle while playing basketball Patient comes in complaining of rolled ankle while he was playing basketball and hurts on the lateral aspect of his left ankle, he also mild swelling inflammation on that side and a lot of pain in his ankle and foot as well.  He swelled up a lot more overnight.  He just rolled it last night.  He says he frequently rolls these ankles and he has fractured his right ankle as well in the past.  He denies any loss of feeling or range of motion in the rest of his foot but mainly just the ankle with the swelling has limited him.  Relevant past medical, surgical, family and social history reviewed and updated as indicated. Interim medical history since our last visit reviewed. Allergies and medications reviewed and updated.  Review of Systems  Constitutional: Negative for chills and fever.  Respiratory: Negative for shortness of breath and wheezing.   Cardiovascular: Negative for chest pain and leg swelling.  Musculoskeletal: Positive for arthralgias and joint swelling. Negative for back pain and gait problem.  Skin: Negative for rash.  All other systems reviewed and are negative.   Per HPI unless specifically indicated above   Allergies as of 01/07/2018   No Known Allergies     Medication List        Accurate as of 01/07/18  9:12 AM. Always use your most recent med list.          ibuprofen 400 MG tablet Commonly known as:  ADVIL,MOTRIN Take 400 mg by mouth every 6 (six) hours as needed.          Objective:    BP 130/83   Pulse 66   Temp (!) 97.4 F (36.3 C) (Oral)   Ht 6\' 5"  (1.956 m)    Wt 200 lb (90.7 kg)   BMI 23.72 kg/m   Wt Readings from Last 3 Encounters:  01/07/18 200 lb (90.7 kg)  11/17/16 198 lb 12.8 oz (90.2 kg)  08/19/16 194 lb (88 kg)    Physical Exam  Constitutional: He is oriented to person, place, and time. He appears well-developed and well-nourished. No distress.  Eyes: Conjunctivae are normal. No scleral icterus.  Musculoskeletal: He exhibits edema and tenderness.       Left ankle: He exhibits decreased range of motion, swelling and ecchymosis. Tenderness. CF ligament and posterior TFL tenderness found. Achilles tendon exhibits no pain and no defect.  Neurological: He is alert and oriented to person, place, and time. Coordination normal.  Skin: Skin is warm and dry. No rash noted. He is not diaphoretic.  Psychiatric: He has a normal mood and affect. His behavior is normal.  Nursing note and vitals reviewed.   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  Follow up plan: Return if symptoms worsen or fail to improve.  Counseling provided for all of the vaccine components Orders Placed This Encounter  Procedures  . DG Ankle Complete Left    Caryl Pina, MD [facility] 01/07/2018, 9:12 AM

## 2018-01-10 ENCOUNTER — Telehealth: Payer: Self-pay | Admitting: Family

## 2018-01-10 NOTE — Telephone Encounter (Signed)
We can change note.

## 2018-01-11 ENCOUNTER — Telehealth: Payer: Self-pay | Admitting: Family

## 2018-01-11 NOTE — Telephone Encounter (Signed)
Letter ready for pick up and place at front desk

## 2018-01-11 NOTE — Telephone Encounter (Signed)
Patient aware  note is ready and advised, work place has sent forms to [employer] for filling out required information relating to patient's visit.

## 2018-01-11 NOTE — Telephone Encounter (Signed)
Yes this is fine.

## 2018-04-15 ENCOUNTER — Ambulatory Visit: Payer: BC Managed Care – PPO | Admitting: Family

## 2018-10-06 ENCOUNTER — Ambulatory Visit: Payer: BC Managed Care – PPO | Admitting: Family

## 2018-12-15 IMAGING — DX DG ANKLE COMPLETE 3+V*L*
3 series · 3 of 3 positions shown · non-contrast
Comparison: None.

CLINICAL DATA: Twisted ankle playing basketball. Pain and swelling.

EXAM:
LEFT ANKLE COMPLETE - 3+ VIEW

[ankle ap]
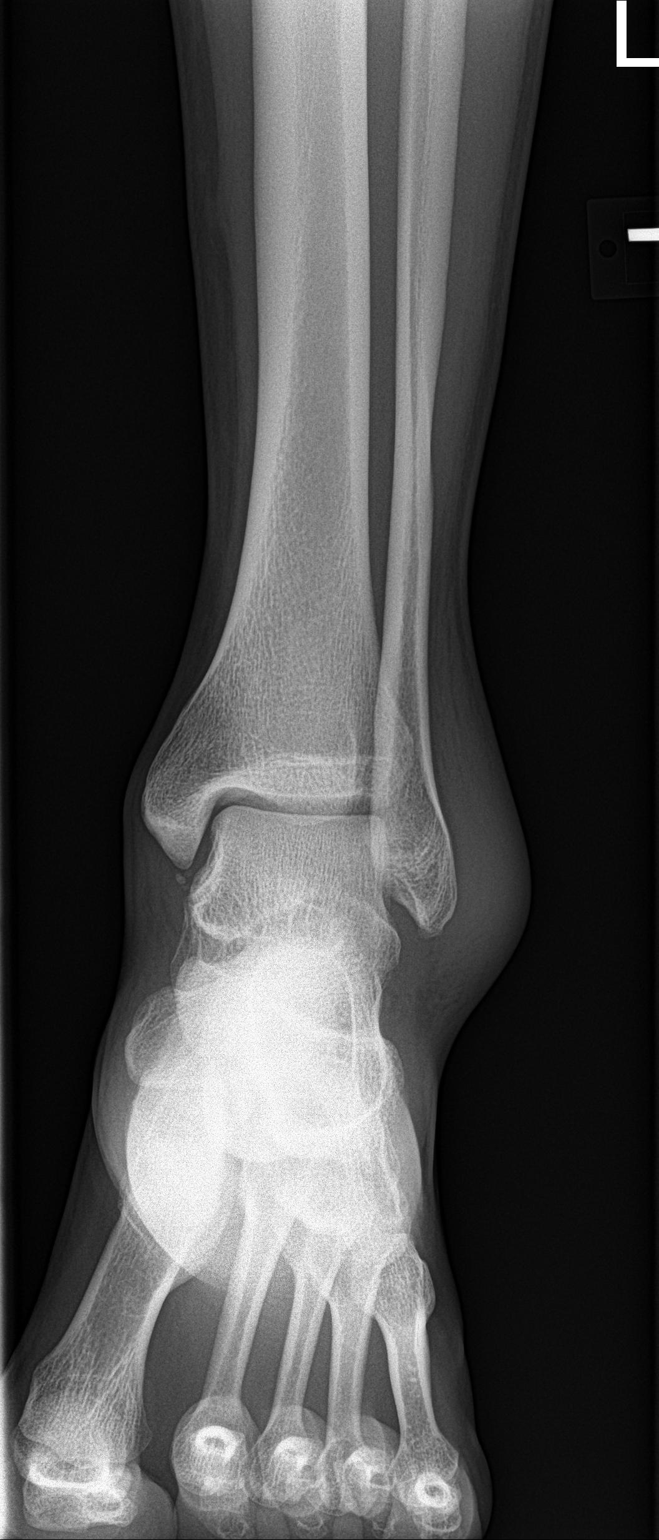

[ankle obl]
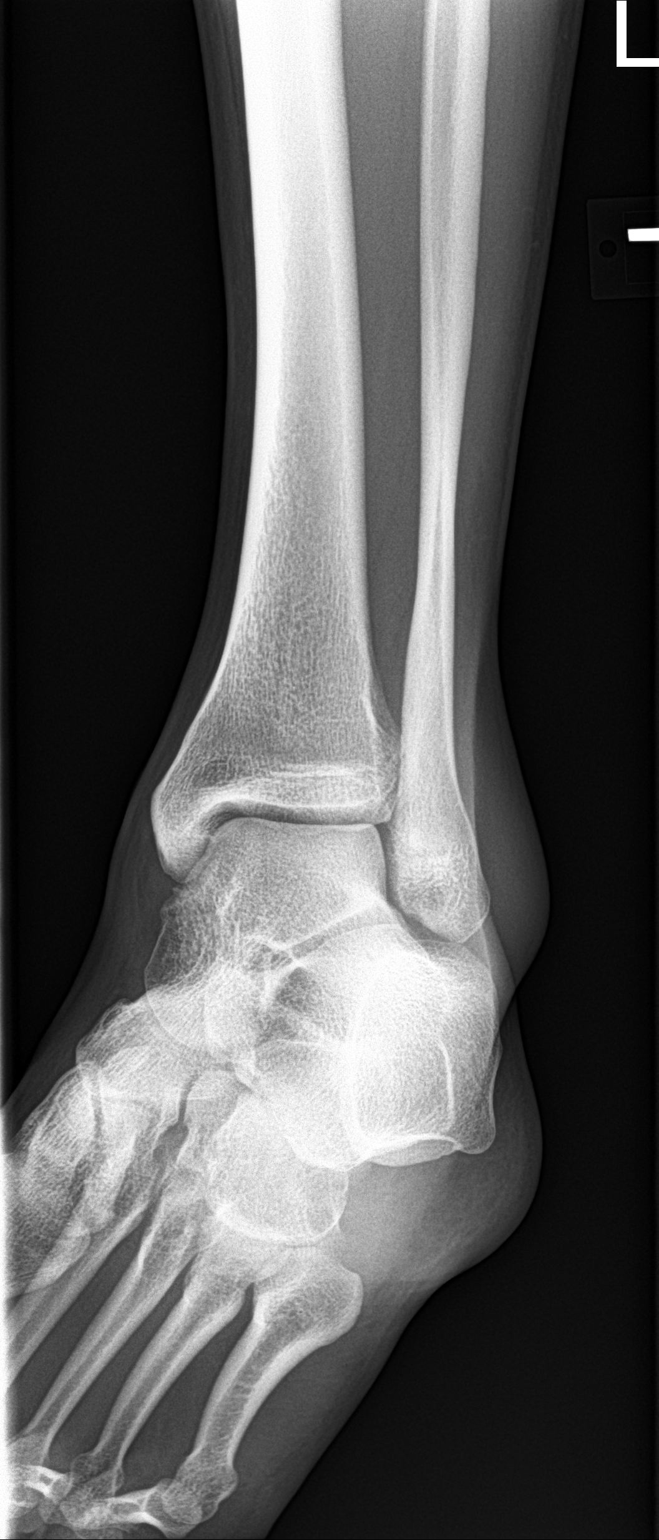

[ankle lat]
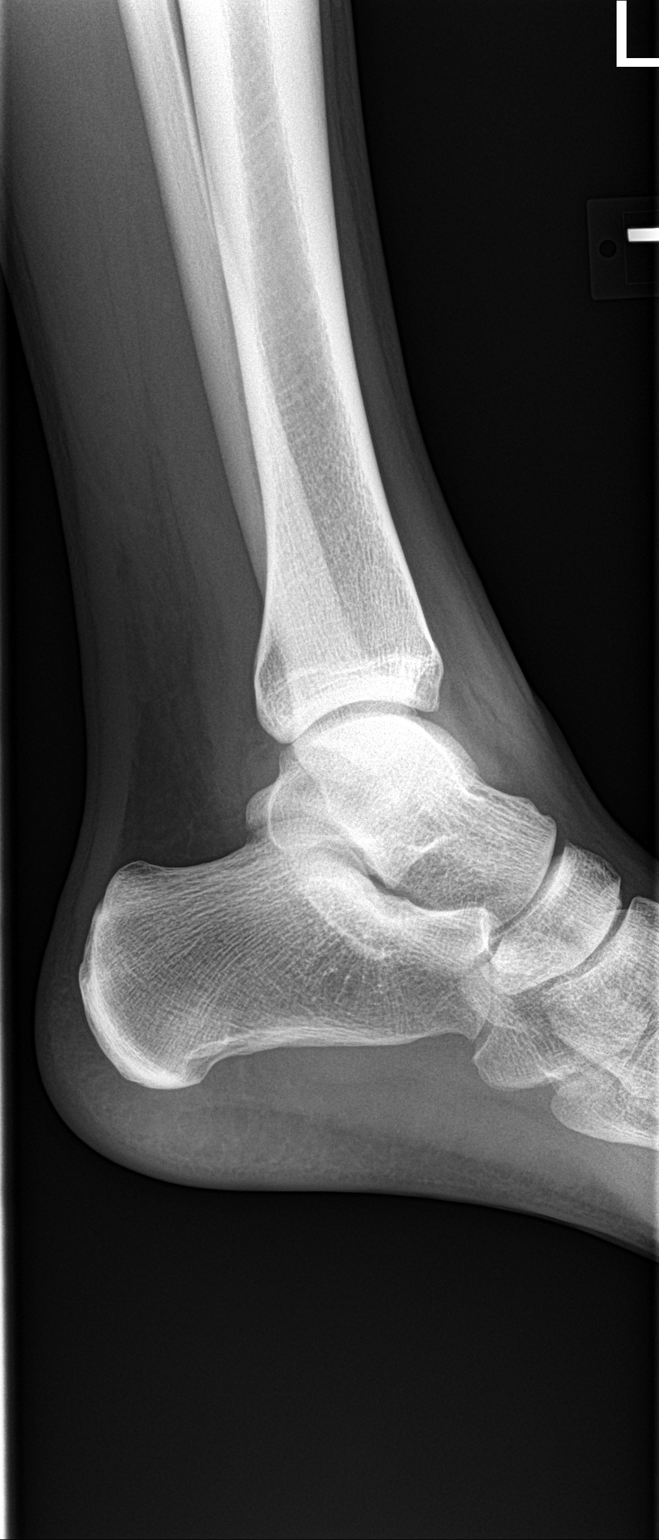

[3 of 3 positions shown; findings below may reference images not displayed]

FINDINGS: Pronounced lateral soft tissue swelling consistent with ligamentous
injury. No evidence fracture. Small joint effusion. Small well
corticated densities inferior to the medial malleolus likely
indicate old medial ligamentous injury.
IMPRESSION: Pronounced lateral soft tissue swelling consistent with ligamentous
injury. No acute fracture.

## 2023-03-26 ENCOUNTER — Other Ambulatory Visit (HOSPITAL_COMMUNITY): Payer: Self-pay | Admitting: Orthopaedic Surgery

## 2023-03-26 DIAGNOSIS — M25571 Pain in right ankle and joints of right foot: Secondary | ICD-10-CM

## 2023-03-28 ENCOUNTER — Ambulatory Visit (HOSPITAL_COMMUNITY)
Admission: RE | Admit: 2023-03-28 | Discharge: 2023-03-28 | Disposition: A | Discharge status: Home / Self Care | Payer: Managed Care, Other (non HMO) | Source: Ambulatory Visit | Attending: Orthopaedic Surgery | Admitting: Orthopaedic Surgery

## 2023-03-28 DIAGNOSIS — M25571 Pain in right ankle and joints of right foot: Secondary | ICD-10-CM | POA: Insufficient documentation
